# Patient Record
Sex: Male | Born: 1980 | Race: White | Hispanic: No | Marital: Married | Smoking: Current every day smoker
Health system: Southern US, Community
[De-identification: ages and names within clinical notes are randomized; demographics above are authoritative.]

## PROBLEM LIST (undated history)

## (undated) DIAGNOSIS — I319 Disease of pericardium, unspecified: Secondary | ICD-10-CM

---

## 2005-08-01 ENCOUNTER — Emergency Department: Payer: Self-pay | Admitting: Emergency Medicine

## 2009-07-27 ENCOUNTER — Emergency Department (HOSPITAL_COMMUNITY)
Admission: EM | Admit: 2009-07-27 | Discharge: 2009-07-27 | Payer: Self-pay | Source: Home / Self Care | Admitting: Family Medicine

## 2015-03-18 ENCOUNTER — Ambulatory Visit
Admission: EM | Admit: 2015-03-18 | Discharge: 2015-03-18 | Disposition: A | Discharge status: Home / Self Care | Payer: 59 | Attending: Family Medicine | Admitting: Family Medicine

## 2015-03-18 ENCOUNTER — Encounter: Payer: Self-pay | Admitting: Emergency Medicine

## 2015-03-18 DIAGNOSIS — L600 Ingrowing nail: Secondary | ICD-10-CM

## 2015-03-18 HISTORY — DX: Disease of pericardium, unspecified: I31.9

## 2015-03-18 MED ORDER — CEFUROXIME AXETIL 500 MG PO TABS: 500.0000 mg | ORAL_TABLET | Freq: Two times a day (BID) | ORAL | Status: DC

## 2015-03-18 MED ORDER — MUPIROCIN 2 % EX OINT: 1.0000 "application " | TOPICAL_OINTMENT | Freq: Three times a day (TID) | CUTANEOUS | Status: DC

## 2015-03-18 NOTE — ED Provider Notes (Signed)
CSN: [id number]     Arrival date & time 03/18/15  1110 History   First MD Initiated Contact with Patient 03/18/15 1242   Nurses notes were reviewed. Chief Complaint  Patient presents with  . Nail Problem   patient's had recurrent ingrown toenails with  nails growing into the toe beds on both sides. States this was normal for couple weeks tender to touch and drainage coming from the toe. He's not been soaking it but is point some peroxide on it night because.  (Consider location/radiation/quality/duration/timing/severity/associated sxs/prior Treatment) Patient is a 34 y.o. male presenting with toe pain. The history is provided by the patient. No language interpreter was used.  Toe Pain This is a recurrent problem. The current episode started more than 1 week ago. The problem occurs constantly. The problem has been gradually worsening. Pertinent negatives include no chest pain and no abdominal pain. The symptoms are aggravated by walking. Nothing relieves the symptoms. Treatments tried: peroxide.    Past Medical History  Diagnosis Date  . Pericarditis    History reviewed. No pertinent past surgical history. History reviewed. No pertinent family history. Social History  Substance Use Topics  . Smoking status: Never Smoker   . Smokeless tobacco: None  . Alcohol Use: Yes    Review of Systems  Cardiovascular: Negative for chest pain.  Gastrointestinal: Negative for abdominal pain.  Musculoskeletal: Positive for gait problem.       Left toe pain  Skin: Positive for color change. Negative for pallor, rash and wound.  All other systems reviewed and are negative.   Allergies  Review of patient's allergies indicates no known allergies.  Home Medications   Prior to Admission medications   Medication Sig Start Date End Date Taking? Authorizing Provider  ibuprofen (ADVIL,MOTRIN) 800 MG tablet Take 800 mg by mouth every 8 (eight) hours as needed.   Yes Historical Provider, MD  cefUROXime  (CEFTIN) 500 MG tablet Take 1 tablet (500 mg total) by mouth 2 (two) times daily. 03/18/15   Hassan Rowan, MD  mupirocin ointment (BACTROBAN) 2 % Apply 1 application topically 3 (three) times daily. 03/18/15   Hassan Rowan, MD   Meds Ordered and Administered this Visit  Medications - No data to display  BP 142/84 mmHg  Pulse 67  Temp(Src) 97.5 F (36.4 C) (Tympanic)  Resp 20  Ht  (1.88 m)  Wt 330 lb (149.687 kg)  BMI 42.35 kg/m2  SpO2 100% No data found.   Physical Exam  Constitutional: He is oriented to person, place, and time. He appears well-developed.  Obese white male  HENT:  Head: Normocephalic and atraumatic.  Eyes: Conjunctivae are normal. Pupils are equal, round, and reactive to light.  Musculoskeletal: He exhibits tenderness.  Left toe at the medial base swollen pus can be elicited with pressure.  Neurological: He is alert and oriented to person, place, and time.  Skin: Skin is warm and dry.  Psychiatric: He has a normal mood and affect.  Vitals reviewed.   ED Course  Procedures (including critical care time)  Labs Review Labs Reviewed - No data to display  Imaging Review No results found.   Visual Acuity Review  Right Eye Distance:   Left Eye Distance:   Bilateral Distance:    Right Eye Near:   Left Eye Near:    Bilateral Near:         MDM   1. Ingrown left big toenail    ` Informed patient that I do not  do partial toenail removals.  The doctors is on partial toenail removals and use acid to keep the nail from going into the previous groove which is the plantar explained to do a partial toenail removal without removing the nail bed matrix within the nail is doesn't have chest not to heal. I recommend antibiotic ointment and marked by mouth pending foot in Epsom salts soaking and then he can come back in 2 weeks to have the nail removed here or see a podiatrist or his PCP for further treatment and evaluation.    Hassan RowanEugene Jamelah Sitzer, MD 03/18/15  1350

## 2015-03-18 NOTE — Discharge Instructions (Signed)
Ingrown Toenail An ingrown toenail occurs when the corner or sides of your toenail grow into the surrounding skin. The big toe is most commonly affected, but it can happen to any of your toes. If your ingrown toenail is not treated, you will be at risk for infection. CAUSES This condition may be caused by:  Wearing shoes that are too small or tight.  Injury or trauma, such as stubbing your toe or having your toe stepped on.  Improper cutting or care of your toenails.  Being born with (congenital) nail or foot abnormalities, such as having a nail that is too big for your toe. RISK FACTORS Risk factors for an ingrown toenail include:  Age. Your nails tend to thicken as you get older, so ingrown nails are more common in older people.  Diabetes.  Cutting your toenails incorrectly.  Blood circulation problems. SYMPTOMS Symptoms may include:  Pain, soreness, or tenderness.  Redness.  Swelling.  Hardening of the skin surrounding the toe. Your ingrown toenail may be infected if there is fluid, pus, or drainage. DIAGNOSIS  An ingrown toenail may be diagnosed by medical history and physical exam. If your toenail is infected, your health care provider may test a sample of the drainage. TREATMENT Treatment depends on the severity of your ingrown toenail. Some ingrown toenails may be treated at home. More severe or infected ingrown toenails may require surgery to remove all or part of the nail. Infected ingrown toenails may also be treated with antibiotic medicines. HOME CARE INSTRUCTIONS  If you were prescribed an antibiotic medicine, finish all of it even if you start to feel better.  Soak your foot in warm soapy water for 20 minutes, 3 times per day or as directed by your health care provider.  Carefully lift the edge of the nail away from the sore skin by wedging a small piece of cotton under the corner of the nail. This may help with the pain. Be careful not to cause more injury  to the area.  Wear shoes that fit well. If your ingrown toenail is causing you pain, try wearing sandals, if possible.  Trim your toenails regularly and carefully. Do not cut them in a curved shape. Cut your toenails straight across. This prevents injury to the skin at the corners of the toenail.  Keep your feet clean and dry.  If you are having trouble walking and are given crutches by your health care provider, use them as directed.  Do not pick at your toenail or try to remove it yourself.  Take medicines only as directed by your health care provider.  Keep all follow-up visits as directed by your health care provider. This is important. SEEK MEDICAL CARE IF:  Your symptoms do not improve with treatment. SEEK IMMEDIATE MEDICAL CARE IF:  You have red streaks that start at your foot and go up your leg.  You have a fever.  You have increased redness, swelling, or pain.  You have fluid, blood, or pus coming from your toenail.   This information is not intended to replace advice given to you by your health care provider. Make sure you discuss any questions you have with your health care provider.   Document Released: 04/19/2000 Document Revised: 09/06/2014 Document Reviewed: 03/16/2014 Elsevier Interactive Patient Education 2016 Else[employer] Inc[employer]  Fingernail or Toenail Removal Fingernail or toenail removal is a surgical procedure to take off a nail from your finger or your toe. You may need to have a fingernail or  toenail removed if it has an abnormal shape (deformity) or if it is severely injured. A fingernail or toenail may also be removed due to a bacterial infection, a severe ingrown toenail, or a fungal infection that has failed treatment with antifungal medicines. LET [facility] PROVID[facility] KNOW ABOUT:  Any allergies you have.  All medicines you are taking, including vitamins, herbs, eye drops, creams, and over-the-counter medicines.  Previous problems you or members of  your family have had with the use of anesthetics.  Any blood disorders you have.  Previous surgeries you have had.  Any medical conditions you may have. RISKS AND COMPLICATIONS Generally, this is a safe procedure. However, problems may occur, including:  Pain.  Bleeding.  Infection.  Regrowth of a deformed nail. BEFORE THE PROCEDURE  Ask your health care provider about changing or stopping your regular medicines. This is especially important if you are taking diabetes medicines or blood thinners.  Follow instructions from your health care provider about eating or drinking restrictions.  Plan to have someone take you home after the procedure. PROCEDURE  An IV tube will be inserted into one of your veins.  You will be given one or more of the following:  A medicine that helps you relax (sedative).  A medicine that numbs the area (local anesthetic).  After your toe or finger is numb, your health care provider will insert a blunt instrument under your nail to lift it up.  In some cases, your health care provider may also make a cut (incision) in your nail.  After your nail is lifted away from your toe or finger, your health care provider will detach it from your nail bed.  A germ-killing bandage (antiseptic dressing) will be put on your toe or finger. The procedure may vary among health care providers and hospitals. AFTER THE PROCEDURE  Your blood pressure, heart rate, breathing rate, and blood oxygen level will be monitored often until the medicines you were given have worn off.  It is common to have some pain after nail removal. You will be given pain medicine as needed.  You may be given a prescription for pain medicine and antibiotic medicine.  If you had a toenail removed, you will be given a surgical shoe to wear while you recover.  If you had a fingernail removed, you may be given a finger splint to wear while you recover.   This information is not intended to  replace advice given to you by your health care provider. Make sure you discuss any questions you have with your health care provider.   Document Released: 01/19/2003 Document Revised: 09/06/2014 Document Reviewed: 04/20/2014 Elsevier Interactive Patient Education [employer] Elsevier Inc.

## 2015-03-18 NOTE — ED Notes (Signed)
Patient presents here with the ingrown toe nail to the lt , is also red and oozing upon arrival, denies any fever

## 2015-04-21 DIAGNOSIS — E669 Obesity, unspecified: Secondary | ICD-10-CM | POA: Insufficient documentation

## 2015-05-02 DIAGNOSIS — E291 Testicular hypofunction: Secondary | ICD-10-CM | POA: Insufficient documentation

## 2015-10-09 DIAGNOSIS — E119 Type 2 diabetes mellitus without complications: Secondary | ICD-10-CM | POA: Insufficient documentation

## 2016-04-09 DIAGNOSIS — N521 Erectile dysfunction due to diseases classified elsewhere: Secondary | ICD-10-CM | POA: Insufficient documentation

## 2017-01-13 ENCOUNTER — Encounter: Payer: Self-pay | Admitting: Urology

## 2017-01-13 ENCOUNTER — Ambulatory Visit (INDEPENDENT_AMBULATORY_CARE_PROVIDER_SITE_OTHER): Payer: 59 | Admitting: Urology

## 2017-01-13 VITALS — BP 133/75 | HR 82 | Ht 74.0 in | Wt 263.5 lb

## 2017-01-13 DIAGNOSIS — E291 Testicular hypofunction: Secondary | ICD-10-CM

## 2017-01-13 NOTE — Progress Notes (Signed)
The patient arrived and is placed in her room. However, he could not wait long enough for me to tell me that his testosterone was normal and likely not the etiology of his fatigue. He left without checking out or being seen.

## 2017-12-31 ENCOUNTER — Encounter: Payer: Self-pay | Admitting: Urology

## 2017-12-31 ENCOUNTER — Other Ambulatory Visit: Payer: Self-pay

## 2017-12-31 ENCOUNTER — Ambulatory Visit (INDEPENDENT_AMBULATORY_CARE_PROVIDER_SITE_OTHER): Payer: 59 | Admitting: Urology

## 2017-12-31 VITALS — BP 142/84 | HR 89 | Ht 73.0 in | Wt 236.1 lb

## 2017-12-31 DIAGNOSIS — Z3009 Encounter for other general counseling and advice on contraception: Secondary | ICD-10-CM | POA: Diagnosis not present

## 2018-01-01 ENCOUNTER — Encounter: Payer: Self-pay | Admitting: Urology

## 2018-01-01 MED ORDER — DIAZEPAM 10 MG PO TABS: ORAL_TABLET | ORAL | 0 refills | Status: DC

## 2018-01-01 NOTE — Progress Notes (Signed)
12/31/2017 12:30 PM   Graciela Husbands 1980/10/24 [id number]  Referring provider: Mickey Farber, MD [address] [facility] [place], [place] [address]  Chief Complaint  Patient presents with  . Vasectomy Consult    HPI: 37 year old male presents for vasectomy counseling.  He is separated with one son.  He is in a current relationship and his partner has 4 children.  He desires vasectomy as a means of permanent sterilization.  There is no previous history of chronic scrotal pain, epididymitis/orchitis.  He does have a history of hypogonadism but is currently not on testosterone replacement.  He has no bothersome lower urinary tract symptoms.  He denies previous history of pelvic or genitourinary surgery.   PMH: Past Medical History:  Diagnosis Date  . Pericarditis     Surgical History: History reviewed. No pertinent surgical history.  Home Medications:  Allergies as of 12/31/2017   No Known Allergies     Medication List        Accurate as of 12/31/17 11:59 PM. Always use your most recent med list.          DEPO-TESTOSTERONE 200 MG/ML injection Generic drug:  testosterone cypionate INJECT 0.63 MLS (125 MG TOTAL) INTO THE MUSCLE ONCE A WEEK   ibuprofen 800 MG tablet Commonly known as:  ADVIL,MOTRIN Take 800 mg by mouth every 8 (eight) hours as needed.       Allergies: No Known Allergies  Family History: Family History  Problem Relation Age of Onset  . Prostate cancer Paternal Grandfather   . Bladder Cancer Neg Hx   . Kidney cancer Neg Hx     Social History:  reports that he has been smoking. He has never used smokeless tobacco. He reports that he drinks alcohol. He reports that he does not use drugs.  ROS: UROLOGY Frequent Urination?: No Hard to postpone urination?: No Burning/pain with urination?: No Get up at night to urinate?: No Leakage of urine?: No Urine stream starts and stops?: No Trouble starting stream?: No Do you have to  strain to urinate?: No Blood in urine?: No Urinary tract infection?: No Sexually transmitted disease?: No Injury to kidneys or bladder?: No Painful intercourse?: No Weak stream?: No Erection problems?: No Penile pain?: No  Gastrointestinal Nausea?: No Vomiting?: No Indigestion/heartburn?: No Diarrhea?: No Constipation?: No  Constitutional Fever: No Night sweats?: No Weight loss?: No Fatigue?: No  Skin Skin rash/lesions?: No Itching?: No  Eyes Blurred vision?: No Double vision?: No  Ears/Nose/Throat Sore throat?: No Sinus problems?: No  Hematologic/Lymphatic Swollen glands?: No Easy bruising?: No  Cardiovascular Leg swelling?: No Chest pain?: No  Respiratory Cough?: No Shortness of breath?: No  Endocrine Excessive thirst?: No  Musculoskeletal Back pain?: No Joint pain?: No  Neurological Headaches?: No Dizziness?: No  Psychologic Depression?: No Anxiety?: No  Physical Exam: BP (!) 142/84   Pulse 89   Ht 6\' 1"  (1.854 m)   Wt 236 lb 1.6 oz (107.1 kg)   BMI 31.15 kg/m   Constitutional:  Alert and oriented, No acute distress. HEENT:  AT, moist mucus membranes.  Trachea midline, no masses. Cardiovascular: No clubbing, cyanosis, or edema. Respiratory: Normal respiratory effort, no increased work of breathing. GI: Abdomen is soft, nontender, nondistended, no abdominal masses GU: No CVA tenderness.  Penis without lesions.  Testes descended bilaterally without masses or tenderness.  Vasa easily palpable bilaterally. Lymph: No cervical or inguinal lymphadenopathy. Skin: No rashes, bruises or suspicious lesions. Neurologic: Grossly intact, no focal deficits, moving  all 4 extremities. Psychiatric: Normal mood and affect.   Assessment & Plan:   37 year old male with undesired fertility who desires to schedule vasectomy.  We had a long discussion about vasectomy. We specifically discussed the procedure, recovery and the risks, benefits and  alternatives of vasectomy. I explained that the procedure entails removal of a segment of each vas deferens, each of which conducts sperm, and that the purpose of this procedure is to cause sterility (inability to produce children or cause pregnancy). Vasectomy is intended to be permanent and irreversible form of contraception. Options for fertility after vasectomy include vasectomy reversal, or sperm retrieval with in vitro fertilization. These options are not always successful, and they may be expensive. We discussed reversible forms of birth control such as condoms, IUD or diaphragms, as well as the option of freezing sperm in a sperm bank prior to the vasectomy procedure. We discussed the importance of avoiding strenuous exercise for four days after vasectomy, and the importance of refraining from any form of ejaculation for seven days after vasectomy. I explained that vasectomy does not produce immediate sterility so another form of contraceptive must be used until sterility is assured by having semen checked for sperm. Thus, a post vasectomy semen analysis is necessary to confirm sterility. Rarely, vasectomy must be repeated. We discussed the approximately 1 in 2,000 risk of pregnancy after vasectomy for men who have post-vasectomy semen analysis showing absent sperm or rare non-motile sperm. Typical side effects include a small amount of oozing blood, some discomfort and mild swelling in the area of incision.  Vasectomy does not affect sexual performance, function, please, sensation, interest, desire, satisfaction, penile erection, volume of semen or ejaculation. Other rare risks include allergy or adverse reaction to an anesthetic, testicular atrophy, hematoma, infection/abscess, prolonged tenderness of the vas deferens, pain, swelling, painful nodule or scar(called sperm granuloma) or epididymtis. We discussed chronic testicular pain syndrome. This has been reported to occur in as many as 1-2% of men and  may be permanent. This can be treated with medication, small procedures or (rarely) surgery.  Rx Valium 10 mg was sent to pharmacy as a preprocedure anxiolytic.  He was informed he will need a driver if utilizing this medication.   Riki AltesScott C Stoioff, MD  [facility] [address], [address] [place], [place] [address] [phone]

## 2018-02-20 ENCOUNTER — Encounter: Payer: 59 | Admitting: Urology

## 2018-07-28 ENCOUNTER — Encounter: Payer: Self-pay | Admitting: Nurse Practitioner

## 2018-07-28 DIAGNOSIS — Z8679 Personal history of other diseases of the circulatory system: Secondary | ICD-10-CM

## 2018-07-28 DIAGNOSIS — E291 Testicular hypofunction: Secondary | ICD-10-CM

## 2018-07-28 DIAGNOSIS — R079 Chest pain, unspecified: Secondary | ICD-10-CM | POA: Diagnosis not present

## 2018-07-28 DIAGNOSIS — R7303 Prediabetes: Secondary | ICD-10-CM

## 2018-07-29 ENCOUNTER — Emergency Department (HOSPITAL_COMMUNITY): Payer: 59

## 2018-07-29 ENCOUNTER — Emergency Department (HOSPITAL_COMMUNITY)
Admission: EM | Admit: 2018-07-29 | Discharge: 2018-07-30 | Disposition: A | Discharge status: Home / Self Care | Payer: 59 | Attending: Emergency Medicine | Admitting: Emergency Medicine

## 2018-07-29 ENCOUNTER — Encounter (HOSPITAL_COMMUNITY): Payer: Self-pay | Admitting: Emergency Medicine

## 2018-07-29 DIAGNOSIS — F17200 Nicotine dependence, unspecified, uncomplicated: Secondary | ICD-10-CM | POA: Diagnosis not present

## 2018-07-29 DIAGNOSIS — R079 Chest pain, unspecified: Secondary | ICD-10-CM

## 2018-07-29 DIAGNOSIS — R0789 Other chest pain: Secondary | ICD-10-CM | POA: Insufficient documentation

## 2018-07-29 DIAGNOSIS — Z79899 Other long term (current) drug therapy: Secondary | ICD-10-CM | POA: Insufficient documentation

## 2018-07-29 LAB — CBC WITH DIFFERENTIAL/PLATELET
Abs Immature Granulocytes: 0.06 10*3/uL (ref 0.00–0.07)
BASOS ABS: 0.1 10*3/uL (ref 0.0–0.1)
Basophils Relative: 1 %
EOS ABS: 0.8 10*3/uL — AB (ref 0.0–0.5)
EOS PCT: 7 %
HEMATOCRIT: 42.3 % (ref 39.0–52.0)
HEMOGLOBIN: 14.8 g/dL (ref 13.0–17.0)
IMMATURE GRANULOCYTES: 1 %
LYMPHS ABS: 2.7 10*3/uL (ref 0.7–4.0)
Lymphocytes Relative: 23 %
MCH: 31.1 pg (ref 26.0–34.0)
MCHC: 35 g/dL (ref 30.0–36.0)
MCV: 88.9 fL (ref 80.0–100.0)
Monocytes Absolute: 0.9 10*3/uL (ref 0.1–1.0)
Monocytes Relative: 7 %
NRBC: 0 % (ref 0.0–0.2)
Neutro Abs: 7.3 10*3/uL (ref 1.7–7.7)
Neutrophils Relative %: 61 %
Platelets: 227 10*3/uL (ref 150–400)
RBC: 4.76 MIL/uL (ref 4.22–5.81)
RDW: 12.5 % (ref 11.5–15.5)
WBC: 11.7 10*3/uL — ABNORMAL HIGH (ref 4.0–10.5)

## 2018-07-29 LAB — I-STAT TROPONIN, ED: Troponin i, poc: 0 ng/mL (ref 0.00–0.08)

## 2018-07-29 NOTE — ED Provider Notes (Signed)
[facility] EMERGENCY DEPARTMENT Provider Note   CSN: [id number] Arrival date & time: 07/29/18  2232    History   Chief Complaint Chief Complaint  Patient presents with  . Leg Pain  . Chest Pain    HPI Douglas Hogan is a 38 y.o. male.    38 year old male presents to the emergency department for evaluation of chest pain.  Was admitted at [facility] 2 days ago for similar complaints with discharge yesterday.  States that he has continued to have intermittent, waxing and waning pressure-like pain across his lower chest at the level of his diaphragm.  It will occasionally radiate into his sternal region.  He has had cold chills with this at times without diaphoresis.  Also reports some increased nausea without vomiting.  Took 4 tablets of  ASA PTA.  Symptoms not aggravated with eating or exertion.  They are sometimes aggravated when lying flat.  No fevers, syncope, leg swelling, back pain, extremity weakness.  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  He does note some chronic LLE pain in his calf which is cramping in nature.  Reports that he told the providers at [facility] about these complaints, but felt they were disregarded.  Is concerned that he may have a blood clot in his leg, but has not had any redness, swelling, other discoloration.  He drives 70 miles to work each way, but has not had any other prolonged travel.  No hospitalizations other than his admission 2 days ago.  He is a 1ppd smoker with borderline DM.  No known FHx of CAD/ACS.  The history is provided by the patient. No language interpreter was used.  Leg Pain  Chest Pain    Past Medical History:  Diagnosis Date  . Pericarditis     There are no active problems to display for this patient.   History reviewed. No pertinent surgical history.      Home Medications    Prior to Admission medications   Medication Sig Start Date  End Date Taking? Authorizing Provider  diazepam (VALIUM) 10 MG tablet 1 tab po 30 min prior to procedure 01/01/18   Stoioff, Verna Czech, MD  ibuprofen (ADVIL,MOTRIN) 800 MG tablet Take 800 mg by mouth every 8 (eight) hours as needed.    [provider]  testosterone cypionate (DEPO-TESTOSTERONE) 200 MG/ML injection INJECT 0.63 MLS (125 MG TOTAL) INTO THE MUSCLE ONCE A WEEK 06/07/16   [provider]    Family History Family History  Problem Relation Age of Onset  . Prostate cancer Paternal Grandfather   . Bladder Cancer Neg Hx   . Kidney cancer Neg Hx     Social History Social History   Tobacco Use  . Smoking status: Current Every Day Smoker  . Smokeless tobacco: Never Used  Substance Use Topics  . Alcohol use: Yes  . Drug use: No     Allergies   Patient has no known allergies.   Review of Systems Review of Systems  Cardiovascular: Positive for chest pain.  Ten systems reviewed and are negative for acute change, except as noted in the HPI.    Physical Exam Updated Vital Signs BP 118/81   Pulse 73   Temp 98.6 F (37 C) (Oral)   Resp 15   Ht  (1.854 m)   Wt 120.2 kg   SpO2 95%   BMI 34.96 kg/m   Physical Exam Vitals  signs and nursing note reviewed.  Constitutional:      General: He is not in acute distress.    Appearance: He is well-developed. He is not diaphoretic.     Comments: Nontoxic appearing and in NAD  HENT:     Head: Normocephalic and atraumatic.  Eyes:     General: No scleral icterus.    Conjunctiva/sclera: Conjunctivae normal.  Neck:     Musculoskeletal: Normal range of motion.  Cardiovascular:     Rate and Rhythm: Normal rate and regular rhythm.     Pulses: Normal pulses.  Pulmonary:     Effort: Pulmonary effort is normal. No respiratory distress.     Breath sounds: No stridor. No wheezing, rhonchi or rales.     Comments: Respirations even and unlabored. Lungs CTAB. Musculoskeletal: Normal range of motion.     Comments:  No BLE edema.  Skin:    General: Skin is warm and dry.     Coloration: Skin is not pale.     Findings: No erythema or rash.  Neurological:     Mental Status: He is alert and oriented to person, place, and time.  Psychiatric:        Behavior: Behavior normal.      ED Treatments / Results  Labs (all labs ordered are listed, but only abnormal results are displayed) Labs Reviewed  CBC WITH DIFFERENTIAL/PLATELET - Abnormal; Notable for the following components:      Result Value   WBC 11.7 (*)    Eosinophils Absolute 0.8 (*)    All other components within normal limits  BASIC METABOLIC PANEL - Abnormal; Notable for the following components:   Glucose, Bld 122 (*)    All other components within normal limits  I-STAT TROPONIN, ED    EKG EKG Interpretation  Date/Time:  Wednesday July 29 2018 22:41:24 EDT Ventricular Rate:  80 PR Interval:    QRS Duration: 102 QT Interval:  352 QTC Calculation: 406 R Axis:   63 Text Interpretation:  Sinus rhythm Normal ECG No previous ECGs available Confirmed by Zadie Rhine ([id number]) on 07/29/2018 11:06:42 PM     Radiology Dg Chest 2 View  Result Date: 07/29/2018 CLINICAL DATA:  Chest pain. EXAM: CHEST - 2 VIEW COMPARISON:  Chest CT yesterday performed at [facility]. FINDINGS: The cardiomediastinal contours are normal. The lungs are clear. Pulmonary vasculature is normal. No consolidation, pleural effusion, or pneumothorax. No acute osseous abnormalities are seen. IMPRESSION: Unremarkable radiographs of the chest. Electronically Signed   By: Narda Rutherford M.D.   On: 07/29/2018 23:44    Procedures Procedures (including critical care time)    Medications Ordered in ED Medications - No data to display   11:15 PM Patient with HEAR score of 2.  If negative troponin may be appropriate for early discharge.  Reports having a chest CT as well as stress test during his hospitalization 2 days ago.  Was told that his stress test was  slightly abnormal and was advised to follow-up with Dr. Excell Seltzer for additional work up.   1:15 AM Results from [facility] reviewed.  It was documented that patient continued to have intermittent episodes of resting chest pain which improved with nitroglycerin, though never completely resolved with this.  He had a negative CTA, ruling out PE.  Patient did undergo a Lexiscan stress test.  Stress portion revealed no EKG changes and was stopped due to fatigue and muscle cramps.  Subsequent myocardial perfusion portion of the test did demonstrate a possible area  of inferior wall ischemia with normal EF.  Appears that Dr. Excell Seltzer of cardiology was consulted regarding this patient's case.  He did not feel that the patient required emergent cardiac catheterization, but did seem to indicate need for return to the ER if patient redeveloped chest pain.   As patient continues to have recurring symptoms and he was recently discharged after admission for chest pain rule out, will consult with cardiology regarding potential need for repeat admission and more urgent/emergent cardiac catheterization.  His work up thus far in the ED today has been reassuring with negative Troponin.  [facility] paged to discuss patient case.  1:29 AM Spoke with Dr. Vonzella Nipple regarding the patient's symptoms as well as the results of his recent myocardial perfusion test.  Dr. Vonzella Nipple believes the most appropriate next step is for an outpatient coronary CT.  He will reach out to the office to see about coordinating this test for the patient within the week in addition to facilitating planned clinic follow up.  Dr. Vonzella Nipple recommends the patient, in the mean time, utilized use of 800mg  ibuprofen BID with meals.  He does not see indication for admission for emergent cardiac catheterization or coronary CT study.   Initial Impression / Assessment and Plan / ED Course  I have reviewed the triage vital signs and the nursing notes.  Pertinent labs & imaging  results that were available during my care of the patient were reviewed by me and considered in my medical decision making (see chart for details).        Patient presents to the emergency department for evaluation of chest pain.  EKG is nonischemic and troponin negative.  Chest x-ray without evidence of mediastinal widening to suggest dissection.  No pneumothorax, pneumonia, pleural effusion.  Pulmonary embolus further considered; however, patient with negative CTA 2 days ago.  Patient discharged yesterday (07/28/18) after overnight admission at [facility] for chest pain r/o.  Was referred to Cardiology for outpatient follow up after abnormal myocardial perfusion study.  Discussed recurrent pain with Dr. Vonzella Nipple of Cardiology service who believes outpatient follow up continues to be appropriate.  Patient advised to start use of ibuprofen BID with meals until f/u in clinic.  Return precautions discussed and provided. Patient discharged in stable condition with no unaddressed concerns.   Final Clinical Impressions(s) / ED Diagnoses   Final diagnoses:  Chest pain, unspecified type    ED Discharge Orders    None       Antony Madura, PA-C 07/30/18 0209    Zadie Rhine, MD 07/30/18 [phone]

## 2018-07-29 NOTE — ED Triage Notes (Signed)
  Patient comes in with chest pressure and L leg pain that has been going on for 2 weeks.  Patient was discharged yesterday from [facility] after being admitted for chest pain.  Patient states everything was ok during hospitalization but would not address his L leg.  Patient feels he has a blood clot in that leg.  Cramping/buring sensation in L calf.  No decrease in sensation or numbness.  Pulses are palpable.  Patient A &O x4.

## 2018-07-29 NOTE — ED Notes (Signed)
Pt dropped off by his father, contact information updated and verified in patients chart.

## 2018-07-30 ENCOUNTER — Telehealth: Payer: Self-pay | Admitting: Cardiology

## 2018-07-30 ENCOUNTER — Telehealth: Payer: Self-pay

## 2018-07-30 LAB — BASIC METABOLIC PANEL
ANION GAP: 11 (ref 5–15)
BUN: 11 mg/dL (ref 6–20)
CHLORIDE: 105 mmol/L (ref 98–111)
CO2: 24 mmol/L (ref 22–32)
Calcium: 9.7 mg/dL (ref 8.9–10.3)
Creatinine, Ser: 0.85 mg/dL (ref 0.61–1.24)
GFR calc non Af Amer: >60 mL/min (ref >60)
GLUCOSE: 122 mg/dL — AB (ref 70–99)
Potassium: 3.8 mmol/L (ref 3.5–5.1)
Sodium: 140 mmol/L (ref 135–145)

## 2018-07-30 MED ORDER — NITROGLYCERIN 0.4 MG SL SUBL: 0.4000 mg | SUBLINGUAL_TABLET | SUBLINGUAL | 3 refills | Status: AC | PRN

## 2018-07-30 NOTE — Discharge Instructions (Addendum)
Your work-up in the emergency department was reassuring and did not reveal a concerning cause of your symptoms.  We recommend that you continue follow-up with cardiology as planned.  Take 800 mg ibuprofen with meals twice a day until your follow-up visit.  You may return to the ED for new or concerning symptoms.

## 2018-07-30 NOTE — Telephone Encounter (Signed)
This documentation is for 07/29/2018. I called the patient as I received a copy of the records from [facility].  The patient has risk factors for coronary artery disease and had abnormal stress test however the patient had excellent effort tolerance and mention to me that on the treadmill he did not have any symptoms whatsoever.  Patient was advised not to do very stressful or strenuous activity.  Sublingual nitroglycerin prescription was sent.  He was told to go to the nearest emergency room for any concerning symptoms. He will be seen in follow-up appointment in the next couple of weeks. Again this documentation is for my interaction with the patient for 07/29/2018

## 2018-07-30 NOTE — Telephone Encounter (Signed)
RN called to schedule patient for webex visit next week and also went over nitroglycerin administration. Patient advised to call office if he needs anything prior to webex.

## 2018-08-04 ENCOUNTER — Telehealth: Payer: Self-pay | Admitting: Cardiology

## 2018-08-04 NOTE — Telephone Encounter (Signed)
Called patient and registered patient and did Verbal consent but patient states that he will not be able to get vitals due to he will be leaving work and no access to a monitor or any BPS. I recommended he get at home possibly night before for a baseline but he has no scales or monitor there either.

## 2018-08-04 NOTE — Telephone Encounter (Signed)
Virtual Visit Pre-Appointment Phone Call  Steps For Call:  1. Confirm consent - "In the setting of the current Covid19 crisis, you are scheduled for a (phone or video) visit with your provider on (date) at (time).  Just as we do with many in-office visits, in order for you to participate in this visit, we must obtain consent.  If you'd like, I can send this to your mychart (if signed up) or email for you to review.  Otherwise, I can obtain your verbal consent now.  All virtual visits are billed to your insurance company just like a normal visit would be.  By agreeing to a virtual visit, we'd like you to understand that the technology does not allow for your provider to perform an examination, and thus may limit your provider's ability to fully assess your condition.  Finally, though the technology is pretty good, we cannot assure that it will always work on either your or our end, and in the setting of a video visit, we may have to convert it to a phone-only visit.  In either situation, we cannot ensure that we have a secure connection.  Are you willing to proceed?"  2. Give patient instructions for WebEx download to smartphone as below if video visit  3. Advise patient to be prepared with any vital sign or heart rhythm information, their current medicines, and a piece of paper and pen handy for any instructions they may receive the day of their visit  4. Inform patient they will receive a phone call 15 minutes prior to their appointment time (may be from unknown caller ID) so they should be prepared to answer  5. Confirm that appointment type is correct in Epic appointment notes (video vs telephone)    TELEPHONE CALL NOTE  Douglas Hogan has been deemed a candidate for a follow-up tele-health visit to limit community exposure during the Covid-19 pandemic. I spoke with the patient via phone to ensure availability of phone/video source, confirm preferred email & phone number, and discuss  instructions and expectations.  I reminded Douglas Hogan to be prepared with any vital sign and/or heart rhythm information that could potentially be obtained via home monitoring, at the time of his visit. I reminded Douglas Hogan to expect a phone call at the time of his visit if his visit.  Did the patient verbally acknowledge consent to treatment? YES  Minette Brine 08/04/2018 2:08 PM   DOWNLOADING THE WEBEX SOFTWARE TO SMARTPHONE  - If Apple, go to [employer] and type in WebEx in the search bar. Download [employer], the blue/green circle. The app is free but as with any other app downloads, their phone may require them to verify saved payment information or Apple password. The patient does NOT have to create an account.  - If Android, ask patient to go to [employer] and type in WebEx in the search bar. Download [employer], the blue/green circle. The app is free but as with any other app downloads, their phone may require them to verify saved payment information or Android password. The patient does NOT have to create an account.   CONSENT FOR TELE-HEALTH VISIT - PLEASE REVIEW  I hereby voluntarily request, consent and authorize [facility] and its employed or contracted physicians, physician assistants, nurse practitioners or other licensed health care professionals (the Practitioner), to provide me with telemedicine health care services (the Services") as deemed necessary by the treating Practitioner. I  acknowledge and consent to receive the Services by the Practitioner via telemedicine. I understand that the telemedicine visit will involve communicating with the Practitioner through live audiovisual communication technology and the disclosure of certain medical information by electronic transmission. I acknowledge that I have been given the opportunity to request an in-person assessment or other available alternative prior to the telemedicine visit and am  voluntarily participating in the telemedicine visit.  I understand that I have the right to withhold or withdraw my consent to the use of telemedicine in the course of my care at any time, without affecting my right to future care or treatment, and that the Practitioner or I may terminate the telemedicine visit at any time. I understand that I have the right to inspect all information obtained and/or recorded in the course of the telemedicine visit and may receive copies of available information for a reasonable fee.  I understand that some of the potential risks of receiving the Services via telemedicine include:   Delay or interruption in medical evaluation due to technological equipment failure or disruption;  Information transmitted may not be sufficient (e.g. poor resolution of images) to allow for appropriate medical decision making by the Practitioner; and/or   In rare instances, security protocols could fail, causing a breach of personal health information.  Furthermore, I acknowledge that it is my responsibility to provide information about my medical history, conditions and care that is complete and accurate to the best of my ability. I acknowledge that Practitioner's advice, recommendations, and/or decision may be based on factors not within their control, such as incomplete or inaccurate data provided by me or distortions of diagnostic images or specimens that may result from electronic transmissions. I understand that the practice of medicine is not an exact science and that Practitioner makes no warranties or guarantees regarding treatment outcomes. I acknowledge that I will receive a copy of this consent concurrently upon execution via email to the email address I last provided but may also request a printed copy by calling the office of [place].    I understand that my insurance will be billed for this visit.   I have read or had this consent read to me.  I understand the  contents of this consent, which adequately explains the benefits and risks of the Services being provided via telemedicine.   I have been provided ample opportunity to ask questions regarding this consent and the Services and have had my questions answered to my satisfaction.  I give my informed consent for the services to be provided through the use of telemedicine in my medical care  By participating in this telemedicine visit I agree to the above.

## 2018-08-07 ENCOUNTER — Other Ambulatory Visit: Payer: Self-pay

## 2018-08-07 ENCOUNTER — Telehealth (INDEPENDENT_AMBULATORY_CARE_PROVIDER_SITE_OTHER): Payer: 59 | Admitting: Cardiology

## 2018-08-07 ENCOUNTER — Encounter: Payer: Self-pay | Admitting: Cardiology

## 2018-08-07 VITALS — HR 77 | Ht 73.0 in | Wt 270.0 lb

## 2018-08-07 DIAGNOSIS — R9439 Abnormal result of other cardiovascular function study: Secondary | ICD-10-CM | POA: Diagnosis not present

## 2018-08-07 DIAGNOSIS — R0789 Other chest pain: Secondary | ICD-10-CM | POA: Insufficient documentation

## 2018-08-07 DIAGNOSIS — E119 Type 2 diabetes mellitus without complications: Secondary | ICD-10-CM

## 2018-08-07 DIAGNOSIS — F17210 Nicotine dependence, cigarettes, uncomplicated: Secondary | ICD-10-CM

## 2018-08-07 DIAGNOSIS — E663 Overweight: Secondary | ICD-10-CM | POA: Insufficient documentation

## 2018-08-07 NOTE — Progress Notes (Signed)
Virtual Visit via Video Note    Evaluation Performed:  Follow-up visit  This visit type was conducted due to national recommendations for restrictions regarding the COVID-19 Pandemic (e.g. social distancing).  This format is felt to be most appropriate for this patient at this time.  All issues noted in this document were discussed and addressed.  No physical exam was performed (except for noted visual exam findings with Video Visits).  Please refer to the patient's chart (MyChart message for video visits and phone note for telephone visits) for the patient's consent to telehealth for [facility].  Date:  08/07/2018   ID:  Douglas Hogan, DOB 01-13-1981, MRN [id number]  Patient Location:  [place]   Provider location:   [place]  PCP:  Patient, No Pcp Per  Cardiologist:  No primary care provider on file.  Electrophysiologist:  None   Chief Complaint:  Chest discomfort  History of Present Illness:    Douglas Hogan is a 38 y.o. male who presents via audio/video conferencing for a telehealth visit today.  Patient is a pleasant male with PMH of pericarditis, pre diabetes. I reviewed [name] and [facility] records. He had chest discomfort not relieved with resting and not occurring with exertion. He is a very active gentleman and walks many miles at work without any symptoms. His stress test at [facility] as abnormal and he was given appt with me. Currently he is asymptomatic with no problems at all even on exertion or deep breathing.   The patient does not have symptoms concerning for COVID-19 infection (fever, chills, cough, or new shortness of breath).    Prior CV studies:   The following studies were reviewed today:  [facility] and [facility] records/labs/ stress test reports reviewed extensively.  Past Medical History:  Diagnosis Date  . Pericarditis    No past surgical history on file.   Current Meds  Medication Sig  . ibuprofen (ADVIL,MOTRIN) 800 MG tablet Take 800 mg by mouth  every 8 (eight) hours as needed.     Allergies:   Patient has no known allergies.   Social History   Tobacco Use  . Smoking status: Current Every Day Smoker  . Smokeless tobacco: Never Used  Substance Use Topics  . Alcohol use: Yes  . Drug use: No     Family Hx: The patient's family history includes Prostate cancer in his paternal grandfather. There is no history of Bladder Cancer or Kidney cancer.  ROS:   Please see the history of present illness.     All other systems reviewed and are negative.   Labs/Other Tests and Data Reviewed:    Recent Labs: 07/29/2018: BUN 11; Creatinine, Ser 0.85; Hemoglobin 14.8; Platelets 227; Potassium 3.8; Sodium 140   Recent Lipid Panel No results found for: CHOL, TRIG, HDL, CHOLHDL, LDLCALC, LDLDIRECT  Wt Readings from Last 3 Encounters:  08/07/18 270 lb (122.5 kg)  07/29/18 265 lb (120.2 kg)  12/31/17 236 lb 1.6 oz (107.1 kg)     Objective:    Vital Signs:  Pulse 77   Ht 6\' 1"  (1.854 m)   Wt 270 lb (122.5 kg)   BMI 35.62 kg/m    Well nourished, well developed male in no acute distress. He was comfortable and cheerful. No SOB.  ASSESSMENT & PLAN:    1.  Chest discomfort and abnormal stress test: I reviewed this and reassured him. His cp is atypical for CAD. He will take an ECASAA 81mg  daily. He will keep his  diabetes check and lipids with his PCP. He may need sstatins based on this. He is not keen on it at this time.  2. He will need a follow up in 2-3 months. Knows to go to the ER for Significant symptoms. Nitroglycerine protocol explained and he understood. He will need cath or coronary CT when the viral pandemic resolves. His questions were answered to his satisfaction.   COVID-19 Education: The signs and symptoms of COVID-19 were discussed with the patient and how to seek care for testing (follow up with PCP or arrange E-visit).  The importance of social distancing was discussed today.  Patient Risk:   After full review of  this patient's clinical status, I feel that they are at least moderate risk at this time.  Time:   Today, I have spent 25 minutes with the patient with telehealth technology discussing the above.     Medication Adjustments/Labs and Tests Ordered: Current medicines are reviewed at length with the patient today.  Concerns regarding medicines are outlined above.  Tests Ordered: No orders of the defined types were placed in this encounter.  Medication Changes: No orders of the defined types were placed in this encounter.   Disposition:  Follow up in 3 month(s)  Signed, Garwin Brothers, MD  08/07/2018 4:07 PM    [facility]

## 2018-08-07 NOTE — Patient Instructions (Signed)

## 2018-11-10 ENCOUNTER — Ambulatory Visit: Payer: 59 | Admitting: Cardiology

## 2020-05-03 IMAGING — DX CHEST - 2 VIEW
2 series · 2 of 2 positions shown · non-contrast
Comparison: Chest CT yesterday performed at [facility].

CLINICAL DATA: Chest pain.

EXAM:
CHEST - 2 VIEW

[chest pa]
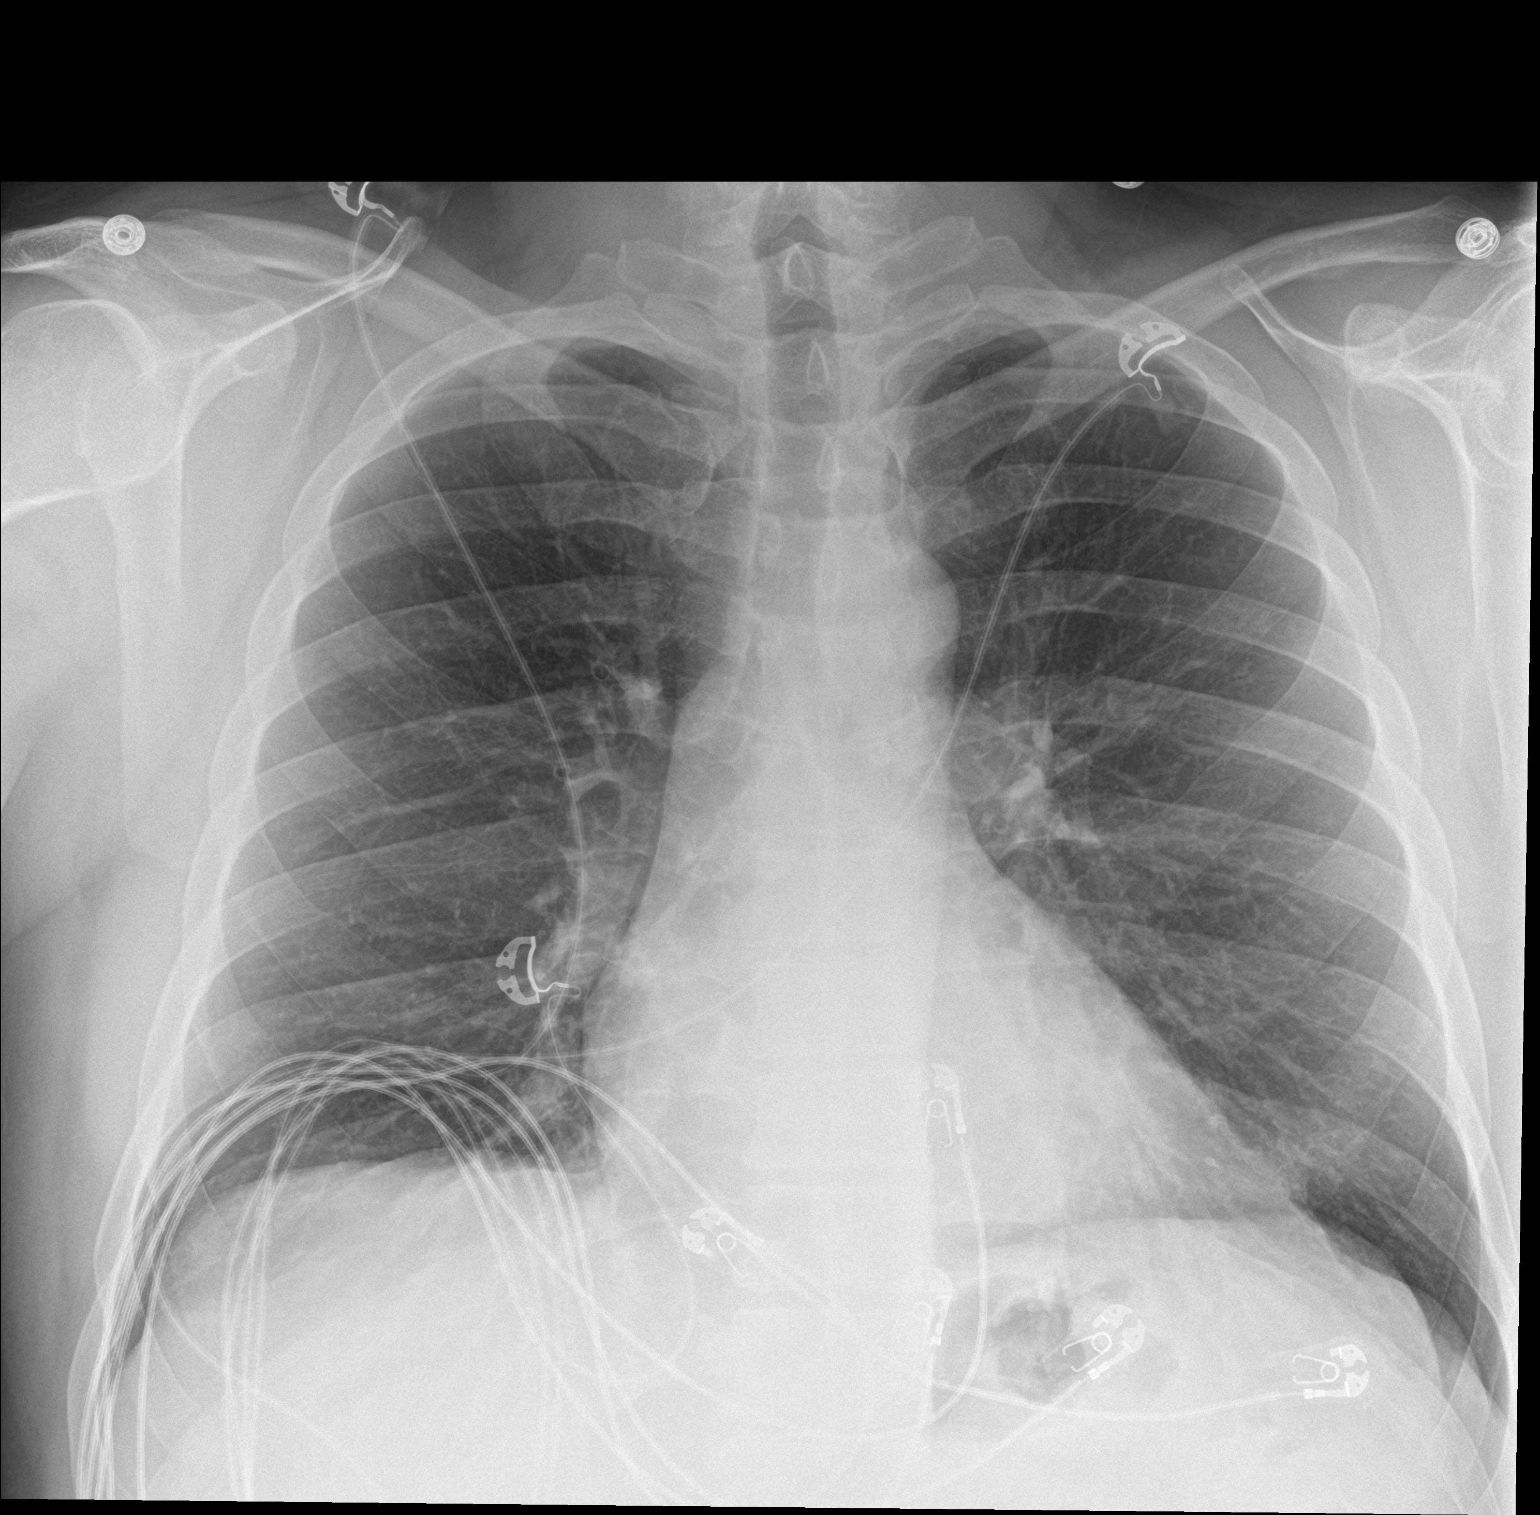

[chest lat]
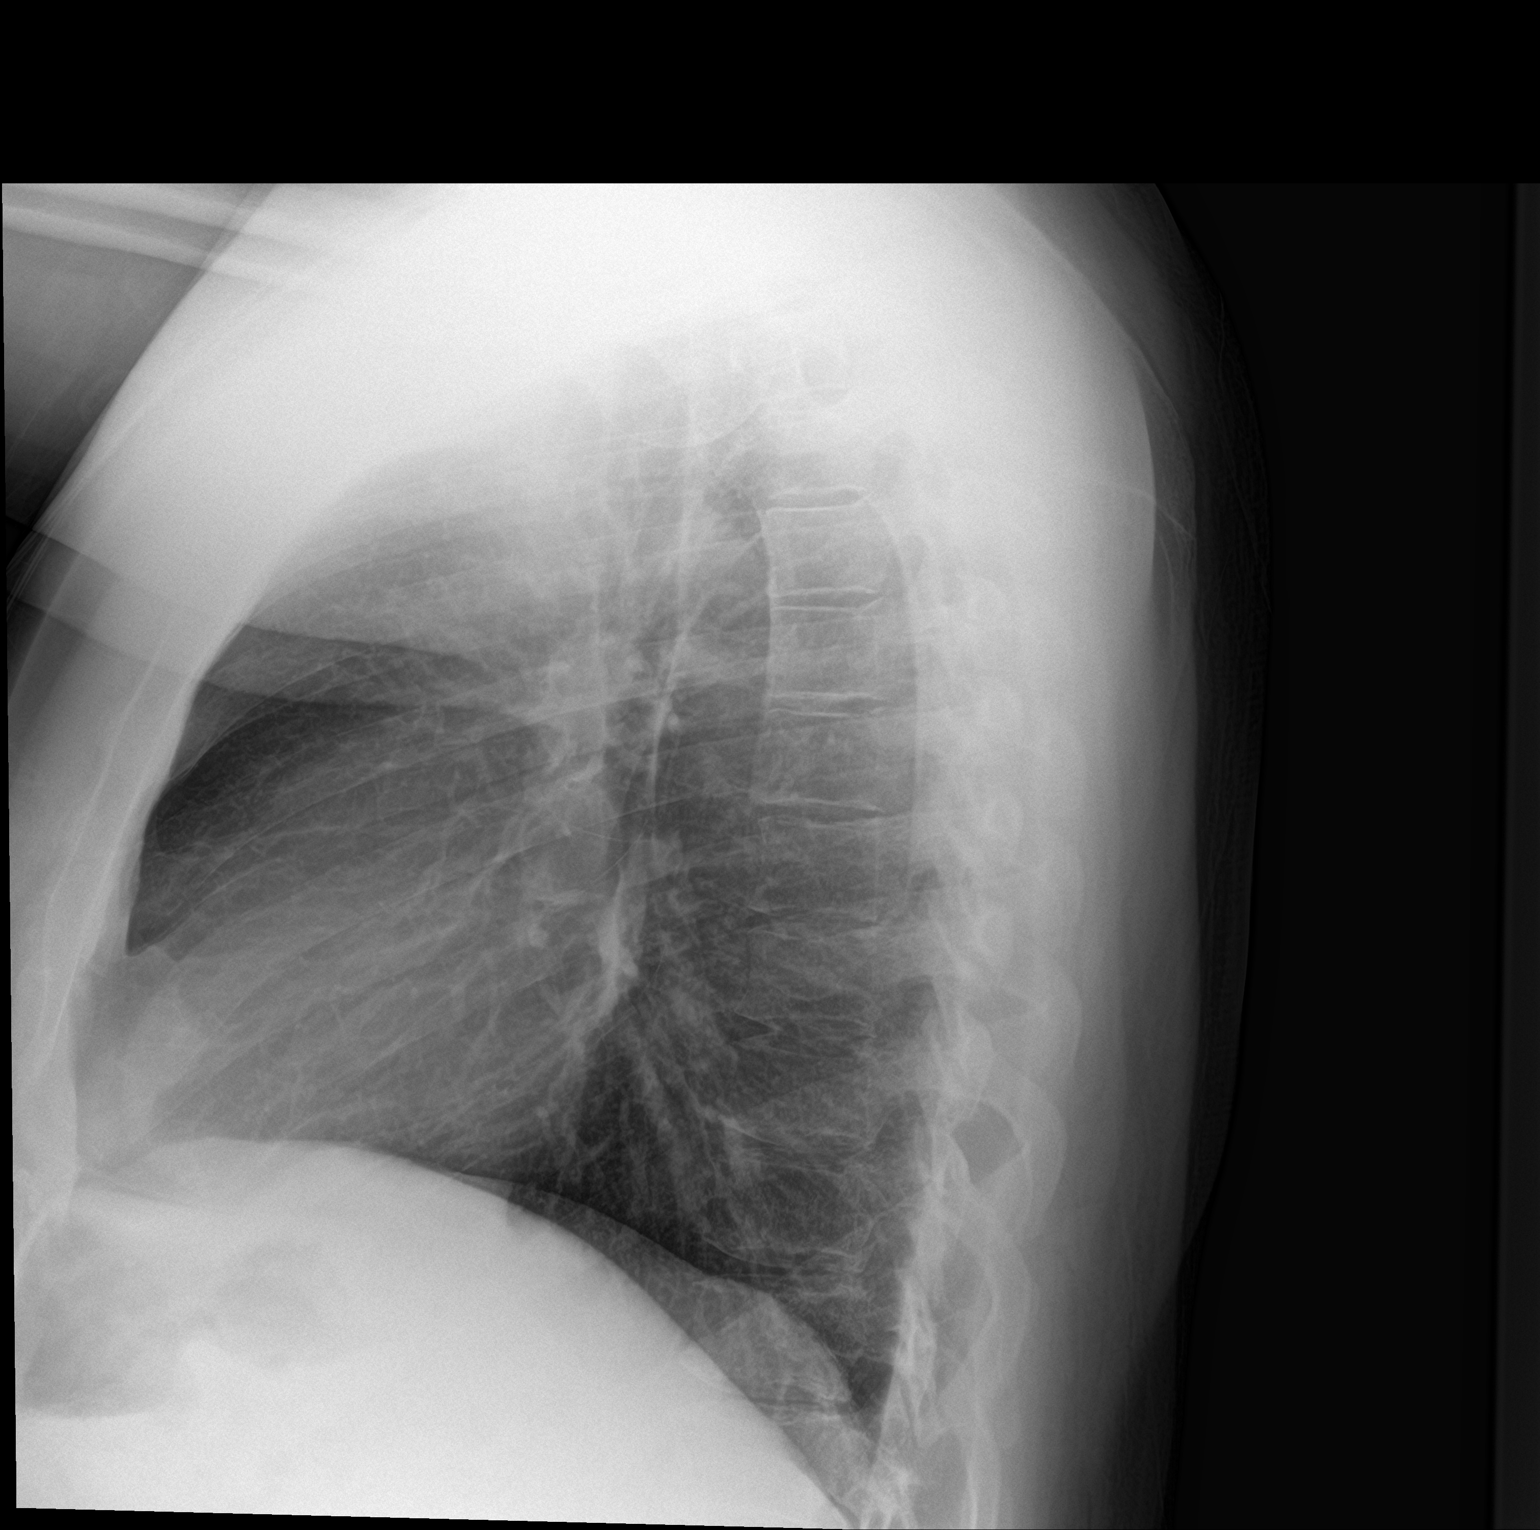

[2 of 2 positions shown; findings below may reference images not displayed]

FINDINGS: The cardiomediastinal contours are normal. The lungs are clear.
Pulmonary vasculature is normal. No consolidation, pleural effusion,
or pneumothorax. No acute osseous abnormalities are seen.
IMPRESSION: Unremarkable radiographs of the chest.

## 2021-01-11 ENCOUNTER — Other Ambulatory Visit (HOSPITAL_COMMUNITY): Payer: Self-pay | Admitting: Family Medicine

## 2021-01-11 ENCOUNTER — Other Ambulatory Visit: Payer: Self-pay | Admitting: Family Medicine

## 2021-01-11 DIAGNOSIS — E291 Testicular hypofunction: Secondary | ICD-10-CM

## 2021-01-19 ENCOUNTER — Ambulatory Visit: Admission: RE | Admit: 2021-01-19 | Payer: Self-pay | Source: Ambulatory Visit

## 2021-01-23 ENCOUNTER — Ambulatory Visit
Admission: RE | Admit: 2021-01-23 | Discharge: 2021-01-23 | Disposition: A | Discharge status: Home / Self Care | Payer: BC Managed Care – PPO | Source: Ambulatory Visit | Attending: Family Medicine | Admitting: Family Medicine

## 2021-01-23 ENCOUNTER — Other Ambulatory Visit: Payer: Self-pay

## 2021-01-23 DIAGNOSIS — E291 Testicular hypofunction: Secondary | ICD-10-CM | POA: Insufficient documentation

## 2021-01-23 MED ORDER — GADOBUTROL 1 MMOL/ML IV SOLN
10.0000 mL | Freq: Once | INTRAVENOUS | Status: AC | PRN
  Administered 2021-01-23: 10 mL via INTRAVENOUS

## 2022-10-29 IMAGING — MR MR HEAD WO/W CM
20 series · 48 of 48 positions shown · IV contrast (GADAVIST)
Comparison: None.

CLINICAL DATA: Headache, dizziness

EXAM:
MRI HEAD WITHOUT AND WITH CONTRAST
TECHNIQUE: Multiplanar, multiecho pulse sequences of the brain and surrounding
structures were obtained without and with intravenous contrast.
CONTRAST:  10mL GADAVIST GADOBUTROL 1 MMOL/ML IV SOLN

[Series 2: T1 · sagittal · 5.0mm · 0.45mm/px · 3 of 25 slices shown (1 of 5)]
[im 1/25]
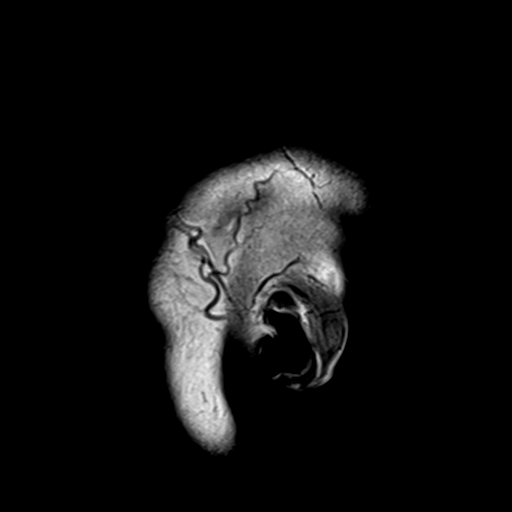
[im 13/25]
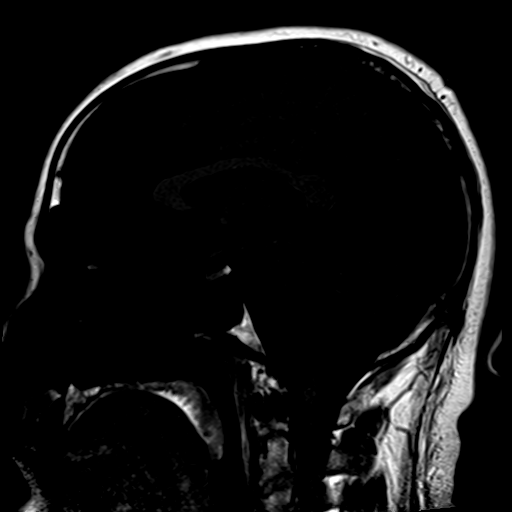
[im 25/25]
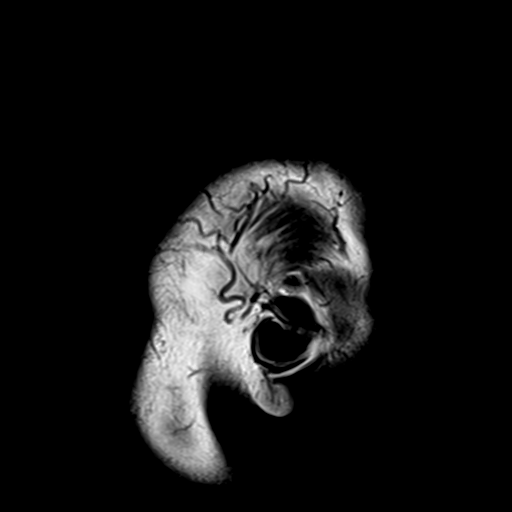

[Series 3: DWI · axial · 3.0mm · 1.20mm/px · z∈[-62,+99]mm · 8 of 110 slices shown (1 of 2)]
[im 1/110]
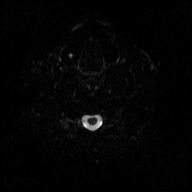
[im 16/110]
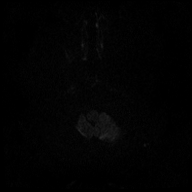
[im 32/110]
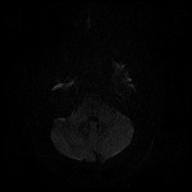
[im 47/110]
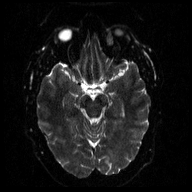
[im 63/110]
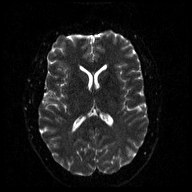
[im 78/110]
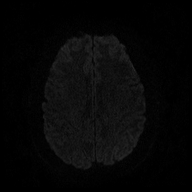
[im 94/110]
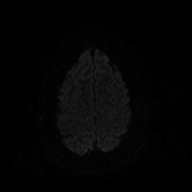
[im 110/110]
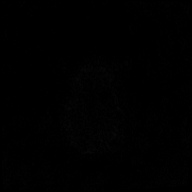

[Series 4: DWI · axial · 3.0mm · 1.20mm/px · z∈[-62,+99]mm · 4 of 55 slices shown (2 of 2)]
[im 1/55]
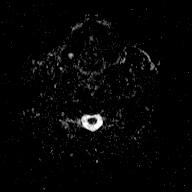
[im 19/55]
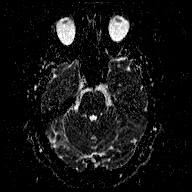
[im 37/55]
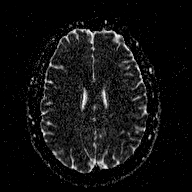
[im 55/55]
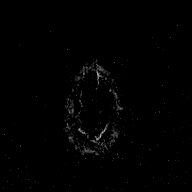

[Series 5: T2 · axial · 5.0mm · 0.90mm/px · z∈[-71,+108]mm · 2 of 31 slices shown (1 of 3)]
[im 1/31]
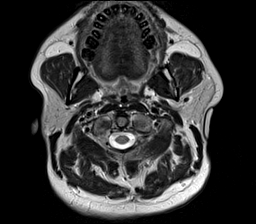
[im 31/31]
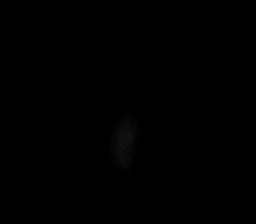

[Series 6: FLAIR · axial · 5.0mm · 0.45mm/px · z∈[-72,+107]mm · 2 of 31 slices shown]
[im 1/31]
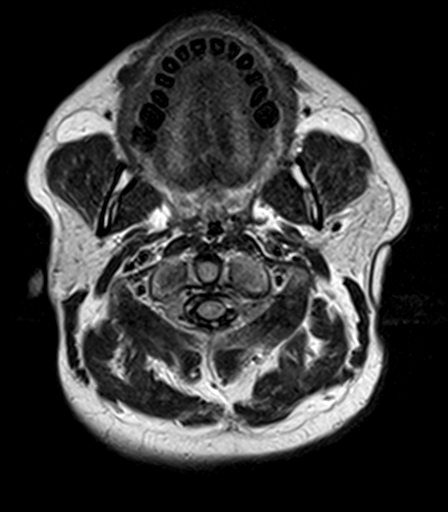
[im 31/31]
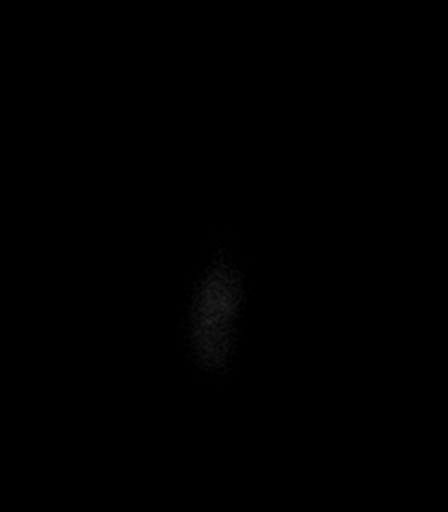

[Series 7: T2 · axial · 5.0mm · 0.90mm/px · z∈[-71,+108]mm · 2 of 31 slices shown (2 of 3)]
[im 1/31]
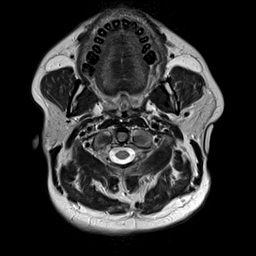
[im 31/31]
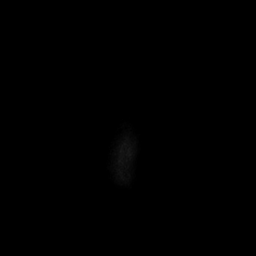

[Series 8: T2 · axial · 5.0mm · 0.72mm/px · z∈[-72,+107]mm · 2 of 31 slices shown (3 of 3)]
[im 1/31]
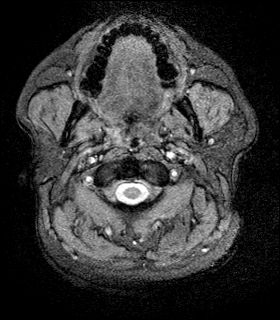
[im 31/31]
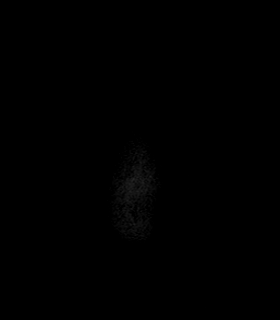

[Series 9: T1 · sagittal · 3.0mm · 0.53mm/px · 1 of 15 slices shown (2 of 5)]
[im 1/15]
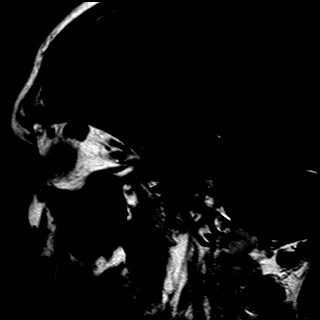

[Series 10: T1 · coronal · 3.0mm · 0.53mm/px · 1 of 15 slices shown (3 of 5)]
[im 1/15]
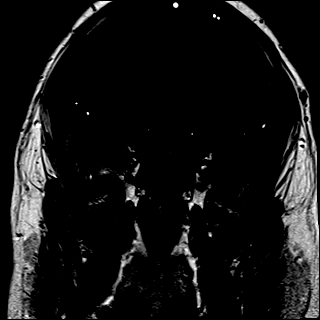

[Series 11: T1 · coronal · non-contrast · 3.0mm · 0.42mm/px · 1 of 9 slices shown (4 of 5)]
[im 1/9]
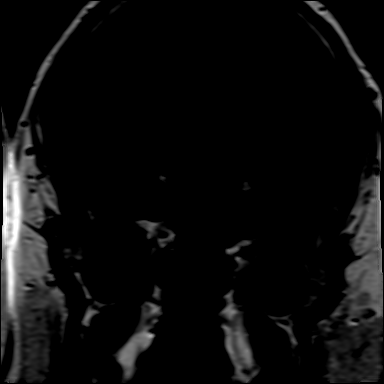

[Series 12: T1 post-contrast · coronal · 3.0mm · 0.62mm/px · 1 of 9 slices shown (1 of 9)]
[im 1/9]
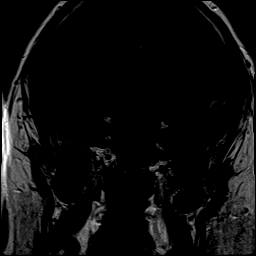

[Series 13: T1 post-contrast · coronal · 3.0mm · 0.62mm/px · 1 of 9 slices shown (2 of 9)]
[im 1/9]
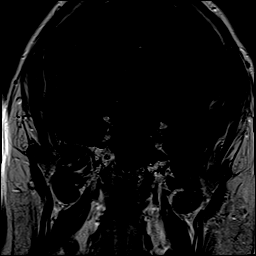

[Series 14: T1 post-contrast · coronal · 3.0mm · 0.62mm/px · 1 of 9 slices shown (3 of 9)]
[im 1/9]
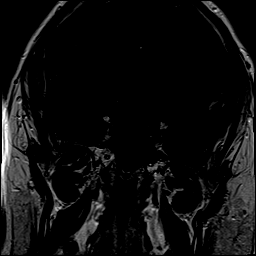

[Series 15: T1 post-contrast · coronal · 3.0mm · 0.62mm/px · 1 of 9 slices shown (4 of 9)]
[im 1/9]
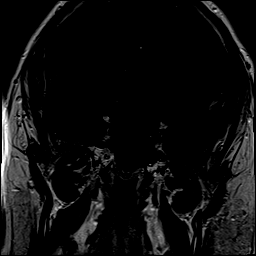

[Series 16: T1 post-contrast · coronal · 3.0mm · 0.62mm/px · 1 of 9 slices shown (5 of 9)]
[im 1/9]
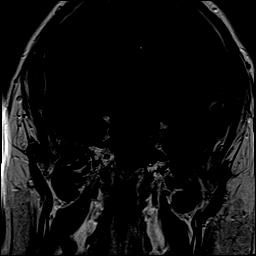

[Series 17: T1 post-contrast · coronal · 3.0mm · 0.62mm/px · 1 of 9 slices shown (6 of 9)]
[im 1/9]
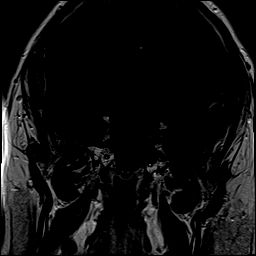

[Series 18: T1 post-contrast · sagittal · 3.0mm · 0.53mm/px · 1 of 15 slices shown (7 of 9)]
[im 1/15]
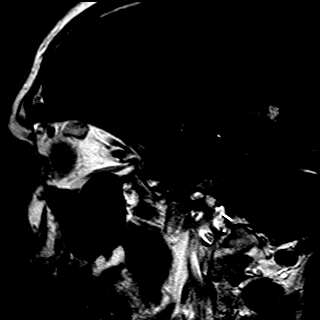

[Series 19: T1 post-contrast · coronal · 3.0mm · 0.44mm/px · 1 of 15 slices shown (8 of 9)]
[im 1/15]
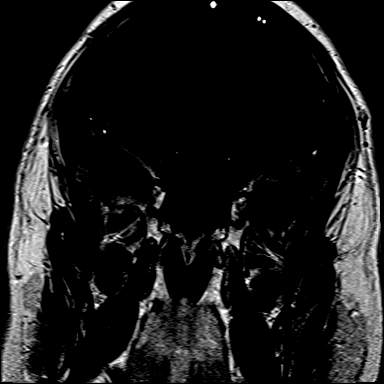

[Series 20: T1 · axial · 1.0mm · 1.00mm/px · z∈[-60,+99]mm · 12 of 160 slices shown (5 of 5)]
[im 1/160]
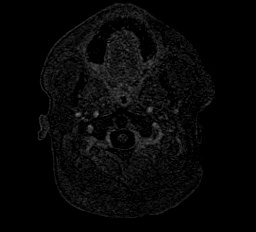
[im 15/160]
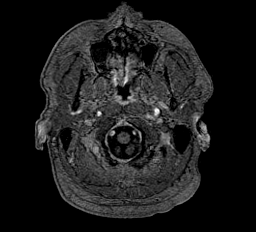
[im 29/160]
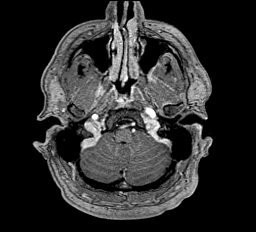
[im 44/160]
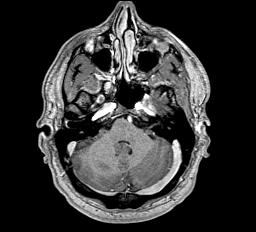
[im 58/160]
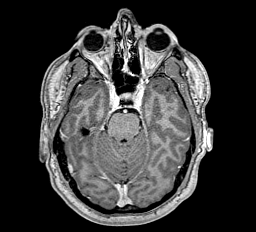
[im 73/160]
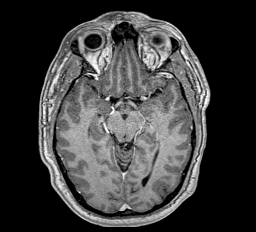
[im 87/160]
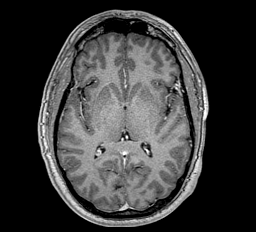
[im 102/160]
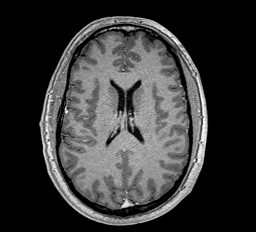
[im 116/160]
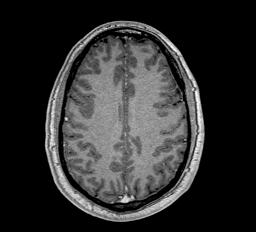
[im 131/160]
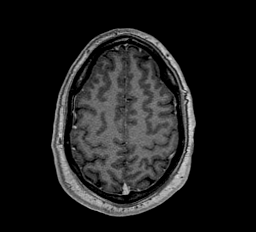
[im 145/160]
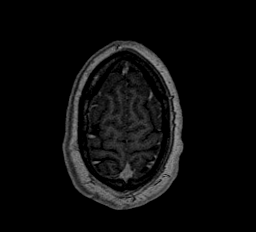
[im 160/160]
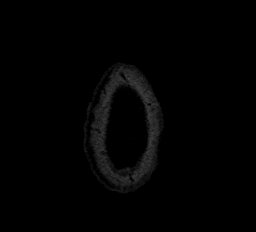

[Series 21: T1 post-contrast · coronal · 5.0mm · 0.43mm/px · 2 of 31 slices shown (9 of 9)]
[im 1/31]
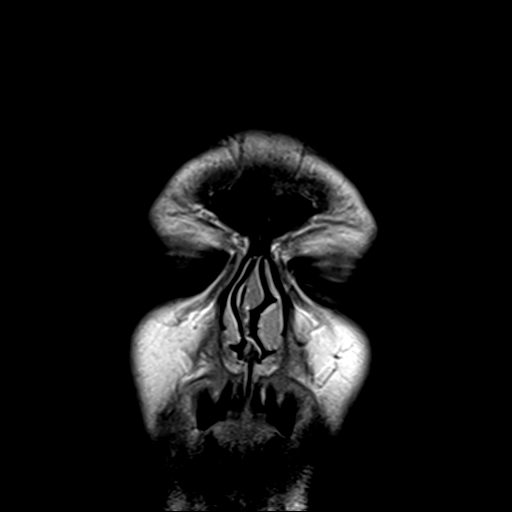
[im 31/31]
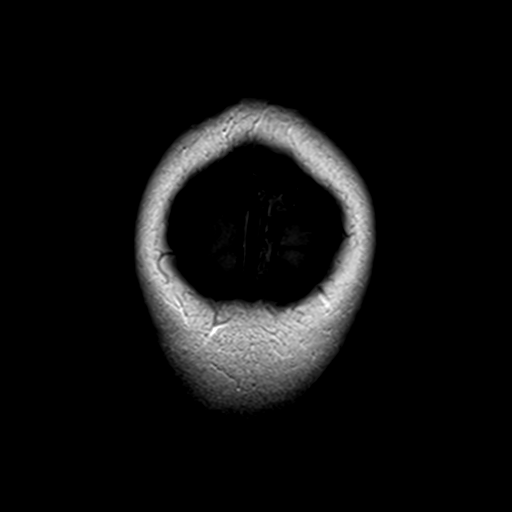

[48 of 48 positions shown; findings below may reference images not displayed]

FINDINGS: Brain: No acute infarction, hemorrhage, hydrocephalus, extra-axial
collection or mass lesion. No abnormal enhancement.

On precontrast imaging, the pituitary is normal in appearance and
demonstrates a posterior pituitary bright spot. The pituitary
enhances normally. The infundibulum is midline. The optic chiasm is
unremarkable.

Vascular: Normal flow voids.

Skull and upper cervical spine: Normal marrow signal.

Sinuses/Orbits: Mucous retention cysts in the right maxillary sinus.
Otherwise negative.

Other: Trace fluid in the right mastoid air cells.
IMPRESSION: No acute intracranial process. Normal pituitary. No etiology is seen
for the patient's dizziness.
